# Patient Record
Sex: Male | Born: 1995 | Race: Black or African American | Hispanic: No | Marital: Single | Smoking: Never smoker
Health system: Southern US, Community
[De-identification: ages and names within clinical notes are randomized; demographics above are authoritative.]

## PROBLEM LIST (undated history)

## (undated) DIAGNOSIS — J302 Other seasonal allergic rhinitis: Secondary | ICD-10-CM

---
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
## 2016-01-04 ENCOUNTER — Emergency Department (HOSPITAL_COMMUNITY)
Admission: EM | Admit: 2016-01-04 | Discharge: 2016-01-04 | Disposition: A | Discharge status: Home / Self Care | Payer: Medicaid Other | Attending: Emergency Medicine | Admitting: Emergency Medicine

## 2016-01-04 ENCOUNTER — Encounter (HOSPITAL_COMMUNITY): Payer: Self-pay | Admitting: Emergency Medicine

## 2016-01-04 ENCOUNTER — Emergency Department (HOSPITAL_COMMUNITY): Payer: Medicaid Other

## 2016-01-04 DIAGNOSIS — R079 Chest pain, unspecified: Secondary | ICD-10-CM | POA: Diagnosis present

## 2016-01-04 DIAGNOSIS — R0789 Other chest pain: Secondary | ICD-10-CM

## 2016-01-04 HISTORY — DX: Other seasonal allergic rhinitis: J30.2

## 2016-01-04 LAB — I-STAT TROPONIN, ED
Troponin i, poc: 0 ng/mL (ref 0.00–0.08)
Troponin i, poc: 0.01 ng/mL (ref 0.00–0.08)

## 2016-01-04 LAB — BASIC METABOLIC PANEL
Anion gap: 10 (ref 5–15)
BUN: 11 mg/dL (ref 6–20)
CO2: 25 mmol/L (ref 22–32)
Calcium: 9.4 mg/dL (ref 8.9–10.3)
Chloride: 104 mmol/L (ref 101–111)
Creatinine, Ser: 1.03 mg/dL (ref 0.61–1.24)
GFR calc Af Amer: >60 mL/min (ref >60)
GFR calc non Af Amer: >60 mL/min (ref >60)
Glucose, Bld: 95 mg/dL (ref 65–99)
Potassium: 4.1 mmol/L (ref 3.5–5.1)
Sodium: 139 mmol/L (ref 135–145)

## 2016-01-04 LAB — CBC
HCT: 48.5 % (ref 39.0–52.0)
Hemoglobin: 16.6 g/dL (ref 13.0–17.0)
MCH: 29.6 pg (ref 26.0–34.0)
MCHC: 34.2 g/dL (ref 30.0–36.0)
MCV: 86.6 fL (ref 78.0–100.0)
Platelets: 209 10*3/uL (ref 150–400)
RBC: 5.6 MIL/uL (ref 4.22–5.81)
RDW: 13.1 % (ref 11.5–15.5)
WBC: 6.2 10*3/uL (ref 4.0–10.5)

## 2016-01-04 NOTE — ED Provider Notes (Signed)
CSN: [id number]     Arrival date & time 01/04/16  1203 History   First MD Initiated Contact with Patient 01/04/16 1605     Chief Complaint  Patient presents with  . Chest Pain     (Consider location/radiation/quality/duration/timing/severity/associated sxs/prior Treatment) HPI   Scott May is an 20 y.o M with no significant pmhx who presents to the ED today c/o chest pain. Pt states that he was walking to class this morning when he felt sudden onset left sided chest pain that progressively worsened. Pt states he had one twinge of pain in his left shoulder but that has since resolved and lasted only momentarily. Pain has been intermittent since that time and now only hurts if he pushes on his chest wall. No associated SOB, diaphoresis, dizziness, paresthesias, syncope, n/v/d, HA, blurry vision.   Past Medical History  Diagnosis Date  . Seasonal allergies    History reviewed. No pertinent past surgical history. History reviewed. No pertinent family history. Social History  Substance Use Topics  . Smoking status: Never Smoker   . Smokeless tobacco: None  . Alcohol Use: No    Review of Systems  All other systems reviewed and are negative.     Allergies  Shellfish allergy  Home Medications   Prior to Admission medications   Medication Sig Start Date End Date Taking? Authorizing Provider  acetaminophen (TYLENOL) 325 MG tablet Take 650 mg by mouth every 6 (six) hours as needed for mild pain, moderate pain or headache.   Yes Historical Provider, MD  cetirizine (ZYRTEC) 10 MG tablet TAKE 1 TABLET BY MOUTH EVERY NIGHT AT BEDTIME AS NEEDED FOR ALLERGIES Patient taking differently: TAKE 10 MG BY MOUTH EVERY NIGHT AT BEDTIME AS NEEDED FOR ALLERGIES 03/14/13  Yes Maia Breslowenise Perez-Fiery, MD  montelukast (SINGULAIR) 5 MG chewable tablet CHEW AND SWALLOW 1 TABLET DAILY Patient not taking: Reported on 01/04/2016 03/14/13   Angelique Blonderenise Perez-Fiery, MD   BP 155/90 mmHg  Pulse 64  Temp(Src)  97.8 F (36.6 C) (Oral)  Resp 18  SpO2 100% Physical Exam  Constitutional: He is oriented to person, place, and time. He appears well-developed and well-nourished. No distress.  HENT:  Head: Normocephalic and atraumatic.  Mouth/Throat: No oropharyngeal exudate.  Eyes: Conjunctivae and EOM are normal. Pupils are equal, round, and reactive to light. Right eye exhibits no discharge. Left eye exhibits no discharge. No scleral icterus.  Cardiovascular: Normal rate, regular rhythm, normal heart sounds and intact distal pulses.  Exam reveals no gallop and no friction rub.   No murmur heard. Pulmonary/Chest: Effort normal and breath sounds normal. No respiratory distress. He has no wheezes. He has no rales. He exhibits tenderness ( left chest wall).  Abdominal: Soft. He exhibits no distension. There is no tenderness. There is no guarding.  Musculoskeletal: Normal range of motion. He exhibits no edema.  Neurological: He is alert and oriented to person, place, and time.  Strength 5/5 throughout. No sensory deficits.  No gait abnormality.   Skin: Skin is warm and dry. No rash noted. He is not diaphoretic. No erythema. No pallor.  Psychiatric: He has a normal mood and affect. His behavior is normal.  Nursing note and vitals reviewed.   ED Course  Procedures (including critical care time) Labs Review Labs Reviewed  BASIC METABOLIC PANEL  CBC  I-STAT TROPOININ, ED  Rosezena SensorI-STAT TROPOININ, ED    Imaging Review Dg Chest 2 View  01/04/2016  CLINICAL DATA:  Left-sided chest pain for 1 day. EXAM: CHEST  2 VIEW COMPARISON:  April 03, 2009 FINDINGS: Lungs are clear. Heart size and pulmonary vascularity are normal. No adenopathy. No pneumothorax. No bone lesions. IMPRESSION: No edema or consolidation. Electronically Signed   By: Bretta Bang III M.D.   On: 01/04/2016 13:04   I have personally reviewed and evaluated these images and lab results as part of my medical decision-making.   EKG  Interpretation   Date/Time:  Thursday January 04 2016 12:09:57 EDT Ventricular Rate:  75 PR Interval:  130 QRS Duration: 80 QT Interval:  344 QTC Calculation: 384 R Axis:   87 Text Interpretation:  Sinus rhythm No old tracing to compare Confirmed by  [facility]  MD, ELLIOTT [phone]) on 01/04/2016 4:49:51 PM      MDM   Final diagnoses:  Chest wall pain    Patient is to be discharged with recommendation to follow up with PCP in regards to today's hospital visit. Chest pain is not likely of cardiac or pulmonary etiology d/t presentation, perc negative, VSS, no tracheal deviation, no JVD or new murmur, RRR, breath sounds equal bilaterally. Chest pain is reproducible with palpation on exam. Likely musculoskeletal in etiology. EKG without acute abnormalities, normal sinus rhythm. Serial negative troponin, and negative CXR. Pt has been advised to take ibuprofen for pain and return to the ED is CP becomes exertional, associated with diaphoresis or nausea, radiates to left jaw/arm, worsens or becomes concerning in any way. Pt appears reliable for follow up and is agreeable to discharge.   Case has been discussed with and seen by Dr. Effie Shy who agrees with the above plan to discharge.      Lester Kinsman [place], PA-C 01/04/16 1723  Mancel Bale, MD 01/05/16 [phone]

## 2016-01-04 NOTE — Discharge Instructions (Signed)
Chest Wall Pain Chest wall pain is pain in or around the bones and muscles of your chest. Sometimes, an injury causes this pain. Sometimes, the cause may not be known. This pain may take several weeks or longer to get better. HOME CARE Pay attention to any changes in your symptoms. Take these actions to help with your pain:  Rest as told by your doctor.  Avoid activities that cause pain. Try not to use your chest, belly (abdominal), or side muscles to lift heavy things.  If directed, apply ice to the painful area:  Put ice in a plastic bag.  Place a towel between your skin and the bag.  Leave the ice on for 20 minutes, 2-3 times per day.  Take over-the-counter and prescription medicines only as told by your doctor.  Do not use tobacco products, including cigarettes, chewing tobacco, and e-cigarettes. If you need help quitting, ask your doctor.  Keep all follow-up visits as told by your doctor. This is important. GET HELP IF:  You have a fever.  Your chest pain gets worse.  You have new symptoms. GET HELP RIGHT AWAY IF:  You feel sick to your stomach (nauseous) or you throw up (vomit).  You feel sweaty or light-headed.  You have a cough with phlegm (sputum) or you cough up blood.  You are short of breath.   This information is not intended to replace advice given to you by your health care provider. Make sure you discuss any questions you have with your health care provider.   Follow-up with your primary care provider or the [facility] as needed or if her symptoms haven't improved. Take ibuprofen or Tylenol for pain. Return to the emergency department if you experience severe worsening of your symptoms, difficulty breathing, dizziness, loss of consciousness, numbness or tingling in extremity, significant sweating, vomiting.

## 2016-01-04 NOTE — ED Notes (Signed)
Patient states he was leaving his class this morning when he began to have left sided chest pain that radiated into his left shoulder. Patient states the pain feels like pressure and is tight, also intermittent. Patient states this is the first time this has happened. Denies any recent injuries, denies any stress triggers. Patient also reports a headache to left side of his head. Patient is A&Ox4, NAD noted.

## 2018-09-29 ENCOUNTER — Emergency Department (HOSPITAL_COMMUNITY): Payer: 59

## 2018-09-29 ENCOUNTER — Encounter (HOSPITAL_COMMUNITY): Payer: Self-pay | Admitting: Emergency Medicine

## 2018-09-29 ENCOUNTER — Emergency Department (HOSPITAL_COMMUNITY)
Admission: EM | Admit: 2018-09-29 | Discharge: 2018-09-29 | Disposition: A | Discharge status: Home / Self Care | Payer: 59 | Attending: Emergency Medicine | Admitting: Emergency Medicine

## 2018-09-29 DIAGNOSIS — R1031 Right lower quadrant pain: Secondary | ICD-10-CM | POA: Insufficient documentation

## 2018-09-29 DIAGNOSIS — R112 Nausea with vomiting, unspecified: Secondary | ICD-10-CM | POA: Diagnosis present

## 2018-09-29 LAB — URINALYSIS, ROUTINE W REFLEX MICROSCOPIC
Bilirubin Urine: NEGATIVE
Glucose, UA: NEGATIVE mg/dL
Hgb urine dipstick: NEGATIVE
Ketones, ur: 20 mg/dL — AB
Leukocytes, UA: NEGATIVE
Nitrite: NEGATIVE
Protein, ur: NEGATIVE mg/dL
Specific Gravity, Urine: 1.043 — ABNORMAL HIGH (ref 1.005–1.030)
pH: 6 (ref 5.0–8.0)

## 2018-09-29 LAB — CBC
HCT: 50 % (ref 39.0–52.0)
Hemoglobin: 16.5 g/dL (ref 13.0–17.0)
MCH: 29.3 pg (ref 26.0–34.0)
MCHC: 33 g/dL (ref 30.0–36.0)
MCV: 88.7 fL (ref 80.0–100.0)
Platelets: 170 10*3/uL (ref 150–400)
RBC: 5.64 MIL/uL (ref 4.22–5.81)
RDW: 13.2 % (ref 11.5–15.5)
WBC: 9.9 10*3/uL (ref 4.0–10.5)
nRBC: 0 % (ref 0.0–0.2)

## 2018-09-29 LAB — COMPREHENSIVE METABOLIC PANEL
ALT: 29 U/L (ref 0–44)
AST: 27 U/L (ref 15–41)
Albumin: 4.2 g/dL (ref 3.5–5.0)
Alkaline Phosphatase: 38 U/L (ref 38–126)
Anion gap: 11 (ref 5–15)
BUN: 9 mg/dL (ref 6–20)
CO2: 26 mmol/L (ref 22–32)
Calcium: 8.8 mg/dL — ABNORMAL LOW (ref 8.9–10.3)
Chloride: 104 mmol/L (ref 98–111)
Creatinine, Ser: 1.04 mg/dL (ref 0.61–1.24)
GFR calc Af Amer: >60 mL/min (ref >60)
GFR calc non Af Amer: >60 mL/min (ref >60)
Glucose, Bld: 118 mg/dL — ABNORMAL HIGH (ref 70–99)
Potassium: 4.1 mmol/L (ref 3.5–5.1)
Sodium: 141 mmol/L (ref 135–145)
Total Bilirubin: 2.4 mg/dL — ABNORMAL HIGH (ref 0.3–1.2)
Total Protein: 6.7 g/dL (ref 6.5–8.1)

## 2018-09-29 LAB — LIPASE, BLOOD: Lipase: 22 U/L (ref 11–51)

## 2018-09-29 MED ORDER — SODIUM CHLORIDE (PF) 0.9 % IJ SOLN
INTRAMUSCULAR | Status: AC
  Filled 2018-09-29: qty 50

## 2018-09-29 MED ORDER — IOPAMIDOL (ISOVUE-300) INJECTION 61%
INTRAVENOUS | Status: AC
  Filled 2018-09-29: qty 100

## 2018-09-29 MED ORDER — ONDANSETRON 8 MG PO TBDP: 8.0000 mg | ORAL_TABLET | Freq: Three times a day (TID) | ORAL | 0 refills | Status: AC | PRN

## 2018-09-29 MED ORDER — SODIUM CHLORIDE 0.9 % IV BOLUS
1000.0000 mL | Freq: Once | INTRAVENOUS | Status: AC
  Administered 2018-09-29: 1000 mL via INTRAVENOUS

## 2018-09-29 MED ORDER — ONDANSETRON HCL 4 MG/2ML IJ SOLN
4.0000 mg | Freq: Once | INTRAMUSCULAR | Status: AC
  Administered 2018-09-29: 4 mg via INTRAVENOUS
  Filled 2018-09-29: qty 2

## 2018-09-29 MED ORDER — MORPHINE SULFATE (PF) 2 MG/ML IV SOLN
2.0000 mg | Freq: Once | INTRAVENOUS | Status: AC
  Administered 2018-09-29: 2 mg via INTRAVENOUS
  Filled 2018-09-29: qty 1

## 2018-09-29 MED ORDER — IOPAMIDOL (ISOVUE-300) INJECTION 61%
100.0000 mL | Freq: Once | INTRAVENOUS | Status: AC | PRN
  Administered 2018-09-29: 100 mL via INTRAVENOUS

## 2018-09-29 NOTE — ED Provider Notes (Signed)
[facility]-EMERGENCY DEPT Provider Note   CSN: [id number] Arrival date & time: 09/29/18  1415     History   Chief Complaint Chief Complaint  Patient presents with  . Abdominal Pain  . Nausea  . Emesis    HPI Scott May is a 22 y.o. male.  HPI   22 year old male previously healthy presents today with onset of nausea vomiting and abdominal pain that began last night.  Patient states last night he woke up from sleep and vomited.  Continued to vomit multiple times.  He has had somewhat hot and cold but has not taken his temperature.  He has noted some abdominal pain that is worse in the right lower quadrant.  He states when he goes to vomit is in the epigastrium but then it seemed to settle into the right lower quadrant.  He has not had any diarrhea or urinary tract infection symptoms.  He denies any previous similar symptoms.  He states he has been unable to take anything by mouth today.  He has not treated this with anything.  He rates the pain a 5 out of 10.  It is worse today but is unable to associate with anything specific  Past Medical History:  Diagnosis Date  . Seasonal allergies     There are no active problems to display for this patient.   History reviewed. No pertinent surgical history.      Home Medications    Prior to Admission medications   Medication Sig Start Date End Date Taking? Authorizing Provider  acetaminophen (TYLENOL) 325 MG tablet Take 650 mg by mouth every 6 (six) hours as needed for mild pain, moderate pain or headache.    [provider]  cetirizine (ZYRTEC) 10 MG tablet TAKE 1 TABLET BY MOUTH EVERY NIGHT AT BEDTIME AS NEEDED FOR ALLERGIES Patient taking differently: TAKE 10 MG BY MOUTH EVERY NIGHT AT BEDTIME AS NEEDED FOR ALLERGIES 03/14/13   Perez-Fiery, Angelique Blonder, MD  montelukast (SINGULAIR) 5 MG chewable tablet CHEW AND SWALLOW 1 TABLET DAILY Patient not taking: Reported on 01/04/2016 03/14/13   Perez-Fiery,  Angelique Blonder, MD    Family History History reviewed. No pertinent family history.  Social History Social History   Tobacco Use  . Smoking status: Never Smoker  . Smokeless tobacco: Never Used  Substance Use Topics  . Alcohol use: No  . Drug use: No     Allergies   Shellfish allergy   Review of Systems Review of Systems  All other systems reviewed and are negative.    Physical Exam Updated Vital Signs BP 140/78 (BP Location: Left Arm)   Pulse (!) 116   Temp 98.7 F (37.1 C) (Oral)   Resp 15   Ht 1.88 m (6\' 2" )   Wt 72.6 kg   SpO2 98%   BMI 20.54 kg/m  Not tachycardiac on my exam Physical Exam  Constitutional: He is oriented to person, place, and time. He appears well-developed and well-nourished.  HENT:  Head: Normocephalic and atraumatic.  Right Ear: External ear normal.  Left Ear: External ear normal.  Nose: Nose normal.  Mouth/Throat: Oropharynx is clear and moist.  Eyes: Pupils are equal, round, and reactive to light. Conjunctivae and EOM are normal.  Neck: Normal range of motion. Neck supple.  Cardiovascular: Normal rate, regular rhythm, normal heart sounds and intact distal pulses.  Pulmonary/Chest: Effort normal and breath sounds normal. No respiratory distress. He has no wheezes. He exhibits no tenderness.  Abdominal: Soft. Bowel sounds  are normal. He exhibits no distension and no mass. There is tenderness in the right lower quadrant. There is no guarding.  Musculoskeletal: Normal range of motion.  Neurological: He is alert and oriented to person, place, and time. He has normal reflexes. He exhibits normal muscle tone. Coordination normal.  Skin: Skin is warm and dry.  Psychiatric: He has a normal mood and affect. His behavior is normal. Judgment and thought content normal.  Nursing note and vitals reviewed.    ED Treatments / Results  Labs (all labs ordered are listed, but only abnormal results are displayed) Labs Reviewed  COMPREHENSIVE METABOLIC  PANEL - Abnormal; Notable for the following components:      Result Value   Glucose, Bld 118 (*)    Calcium 8.8 (*)    Total Bilirubin 2.4 (*)    All other components within normal limits  LIPASE, BLOOD  CBC  URINALYSIS, ROUTINE W REFLEX MICROSCOPIC    EKG None  Radiology No results found.  Procedures Procedures (including critical care time)  Medications Ordered in ED Medications  sodium chloride 0.9 % bolus 1,000 mL (has no administration in time range)  morphine 2 MG/ML injection 2 mg (has no administration in time range)  ondansetron (ZOFRAN) injection 4 mg (has no administration in time range)     Initial Impression / Assessment and Plan / ED Course  I have reviewed the triage vital signs and the nursing notes.  Pertinent labs & imaging results that were available during my care of the patient were reviewed by me and considered in my medical decision making (see chart for details).     22 year old male presents today with nausea vomiting and right lower quadrant pain.  His made n.p.o., placed on IV fluids and given IV fluid bolus.  CT will be obtained to evaluate abdomen specifically with concerns for appendicitis  Final Clinical Impressions(s) / ED Diagnoses   Final diagnoses:  Nausea and vomiting, intractability of vomiting not specified, unspecified vomiting type  Right lower quadrant abdominal pain    ED Discharge Orders    None       Margarita Grizzleay, Jerzi Tigert, MD 09/29/18 1954

## 2018-09-29 NOTE — Discharge Instructions (Addendum)
There is no evidence of appendicitis or other acute intra-abdominal abnormality seen on your CAT scan Please use Zofran and frequent sips of Gatorade mixed half-and-half with water Return for recheck if you have worsening pain or symptoms do not resolve the next 24 to 48 hours

## 2018-09-29 NOTE — ED Triage Notes (Signed)
Patient here from home with complaints of abd pain, n/v that started last night. Unable to keep anything down.

## 2019-11-14 IMAGING — CT CT ABD-PELV W/ CM
2 of 4 series · 16 of 46 positions shown, 18 images · IV contrast (ISOVUE)
Comparison: None.

CLINICAL DATA: Nausea and vomiting

EXAM:
CT ABDOMEN AND PELVIS WITH CONTRAST
TECHNIQUE: Multidetector CT imaging of the abdomen and pelvis was performed
using the standard protocol following bolus administration of
intravenous contrast.
CONTRAST:  100mL [id number] IOPAMIDOL ([id number]) INJECTION 61%

[Series 2: axial st · axial · 0.68mm/px · z∈[-265,+160]mm · 13 of 96 slices shown, 15 images]
[im 6/96  soft-tissue]
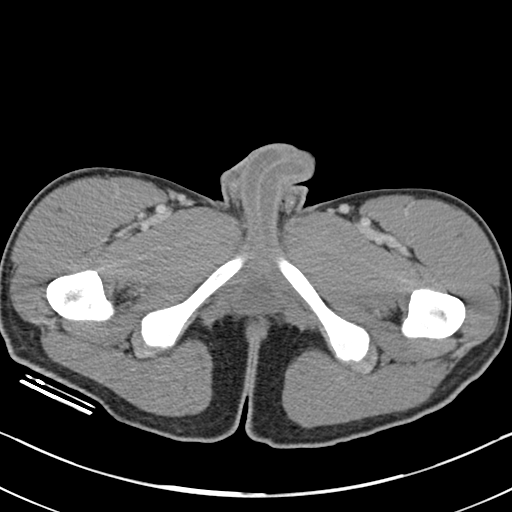
[im 6/96  bone]
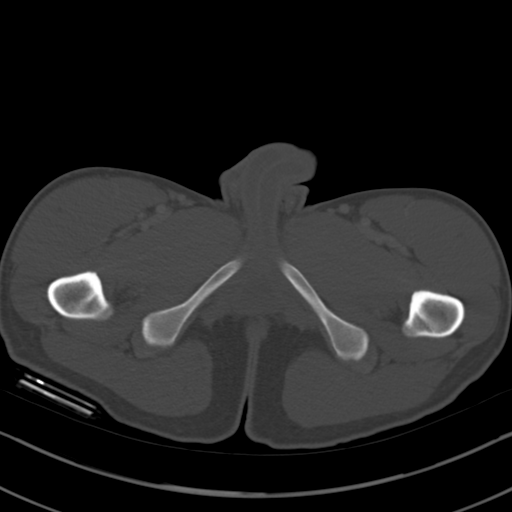
[im 16/96  soft-tissue]
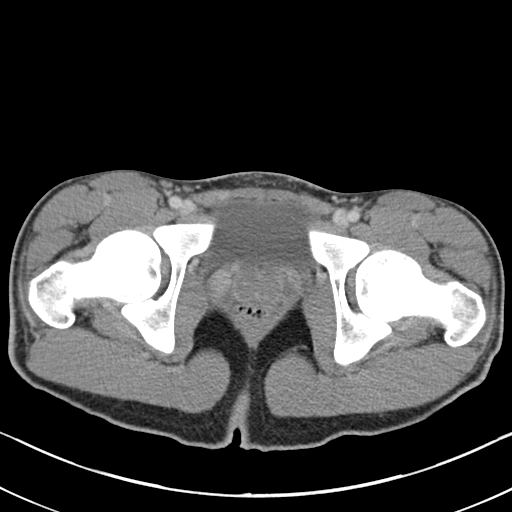
[im 21/96  soft-tissue]
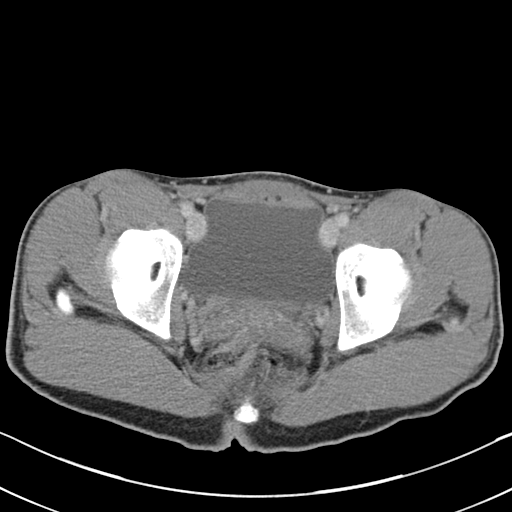
[im 26/96  soft-tissue]
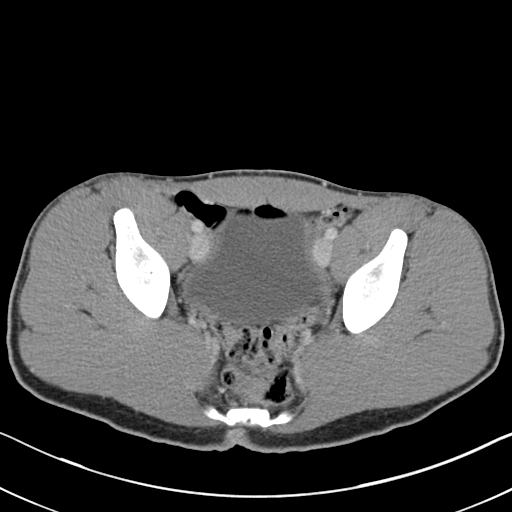
[im 36/96  soft-tissue]
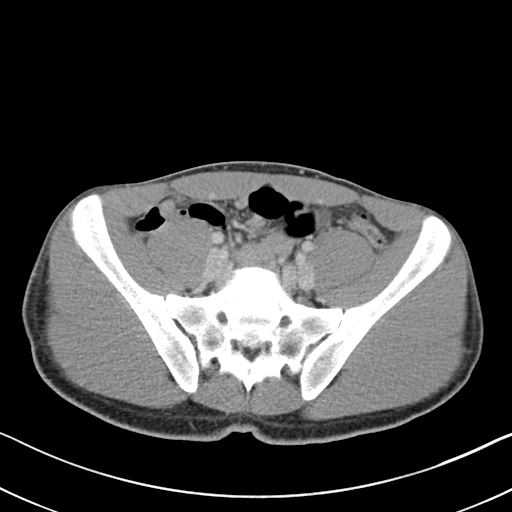
[im 41/96  soft-tissue]
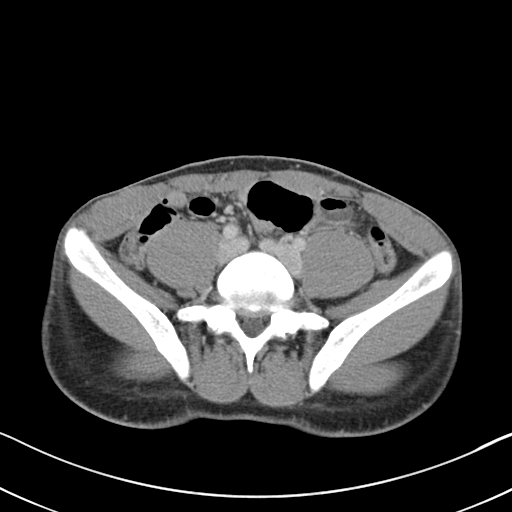
[im 51/96  soft-tissue]
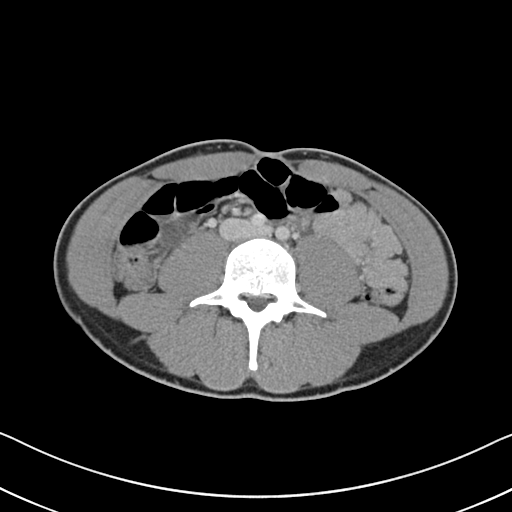
[im 56/96  soft-tissue]
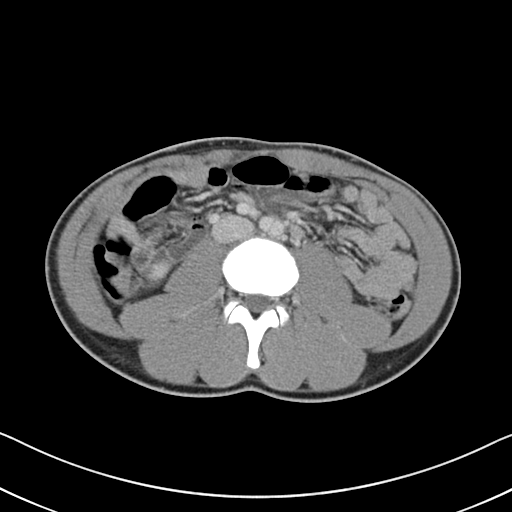
[im 61/96  soft-tissue]
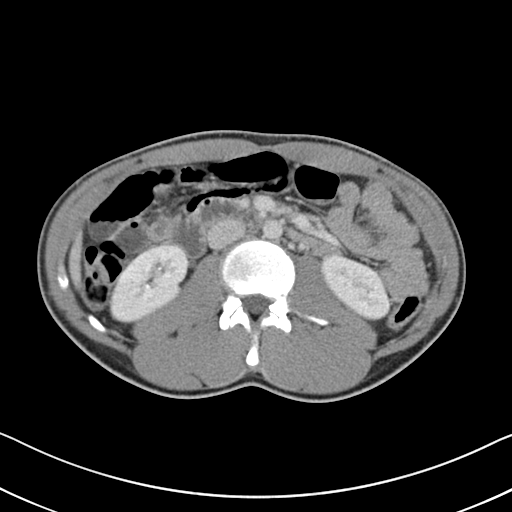
[im 61/96  bone]
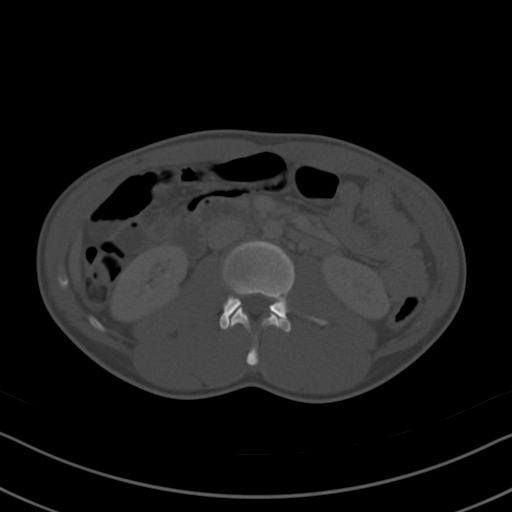
[im 71/96  soft-tissue]
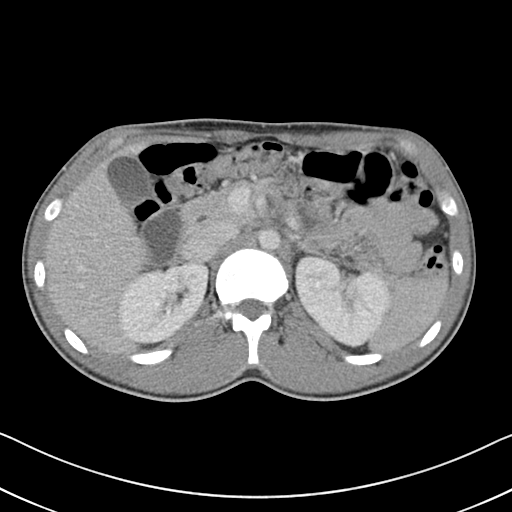
[im 76/96  soft-tissue]
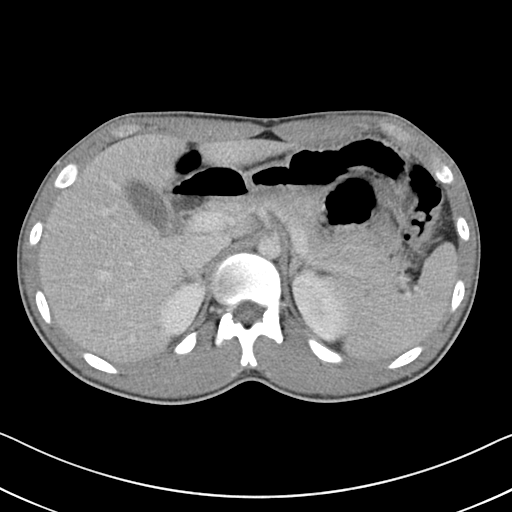
[im 81/96  soft-tissue]
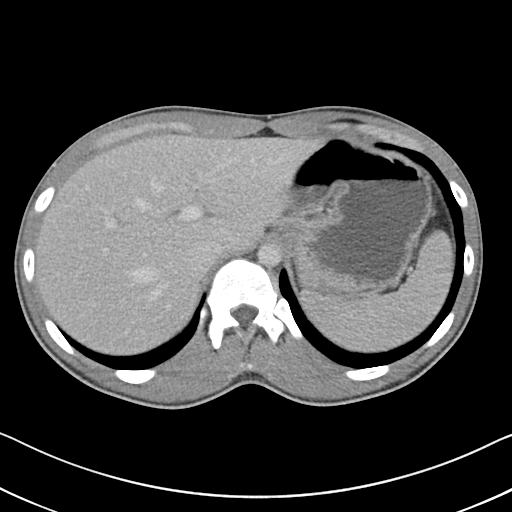
[im 91/96  soft-tissue]
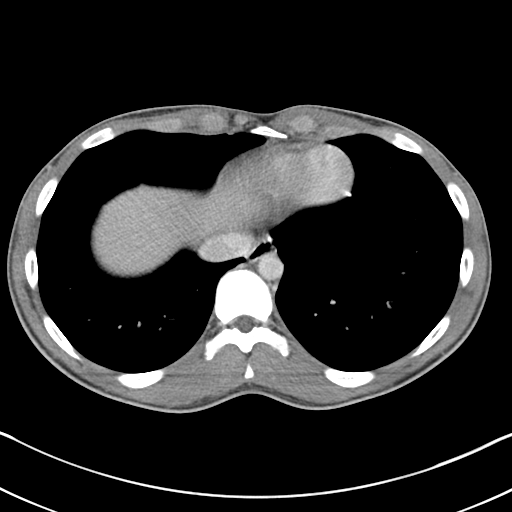

[Series 5: coronal st · coronal · 0.61mm/px · 3 of 64 slices shown]
[im 22/64  soft-tissue]
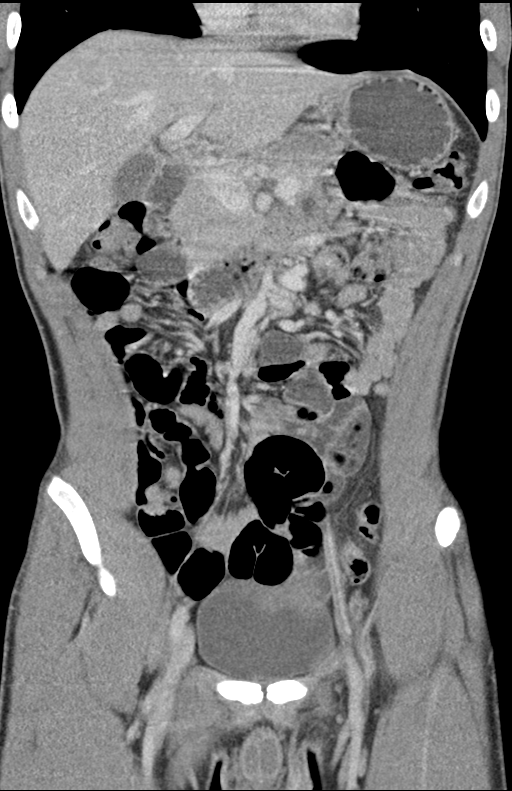
[im 29/64  soft-tissue]
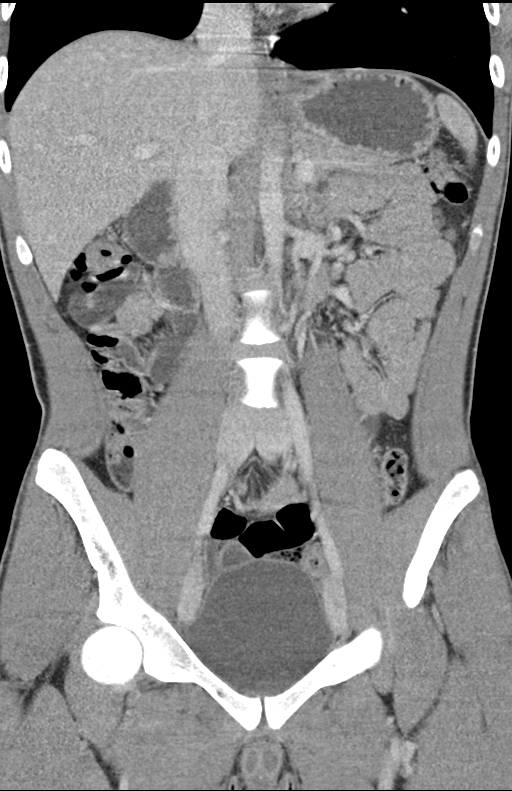
[im 36/64  soft-tissue]
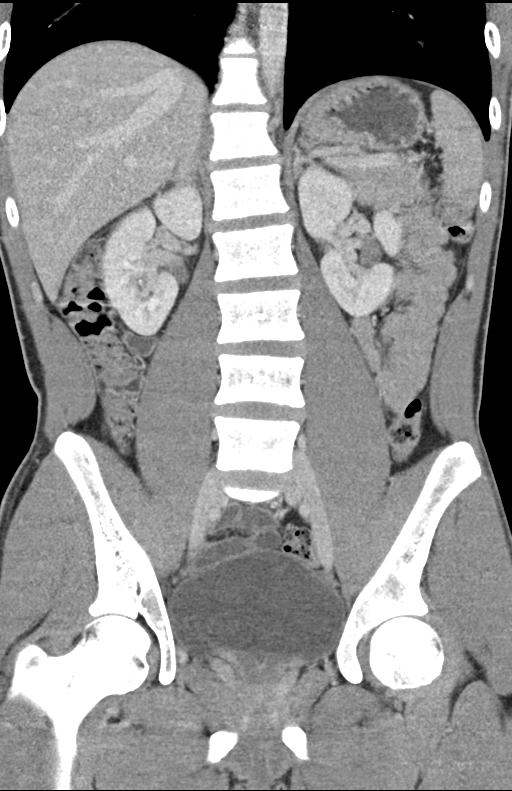

[16 of 46 positions shown; findings below may reference images not displayed]

FINDINGS: Lower chest: Lung bases clear bilaterally

Hepatobiliary: No focal liver abnormality is seen. No gallstones,
gallbladder wall thickening, or biliary dilatation.

Pancreas: Negative

Spleen: Negative

Adrenals/Urinary Tract: Subcentimeter fatty lesion in the right
upper pole likely angiomyolipoma. Small densities in the right
kidney likely early excretion of contrast. No renal obstruction.
Normal urinary bladder.

Stomach/Bowel: Normal stomach. Negative for bowel obstruction. No
bowel mass or edema. Normal appendix.

Vascular/Lymphatic: No significant vascular findings are present. No
enlarged abdominal or pelvic lymph nodes.

Reproductive: Normal prostate

Other: No free fluid.

Musculoskeletal: Negative
IMPRESSION: No acute abnormality

Small densities in right kidney likely early excretion of contrast
or other than nonobstructing renal calculi.

Normal appendix.

## 2023-05-17 ENCOUNTER — Emergency Department (HOSPITAL_COMMUNITY)
Admission: EM | Admit: 2023-05-17 | Discharge: 2023-05-17 | Disposition: A | Discharge status: Home / Self Care | Payer: 59 | Attending: Emergency Medicine | Admitting: Emergency Medicine

## 2023-05-17 ENCOUNTER — Other Ambulatory Visit: Payer: Self-pay

## 2023-05-17 ENCOUNTER — Encounter (HOSPITAL_COMMUNITY): Payer: Self-pay | Admitting: Emergency Medicine

## 2023-05-17 DIAGNOSIS — J028 Acute pharyngitis due to other specified organisms: Secondary | ICD-10-CM | POA: Insufficient documentation

## 2023-05-17 DIAGNOSIS — B9789 Other viral agents as the cause of diseases classified elsewhere: Secondary | ICD-10-CM | POA: Insufficient documentation

## 2023-05-17 DIAGNOSIS — J029 Acute pharyngitis, unspecified: Secondary | ICD-10-CM | POA: Diagnosis present

## 2023-05-17 LAB — GROUP A STREP BY PCR: Group A Strep by PCR: NOT DETECTED

## 2023-05-17 MED ORDER — PREDNISONE 10 MG PO TABS: 40.0000 mg | ORAL_TABLET | Freq: Every day | ORAL | 0 refills | Status: AC

## 2023-05-17 MED ORDER — DEXAMETHASONE 4 MG PO TABS
10.0000 mg | ORAL_TABLET | Freq: Once | ORAL | Status: AC
  Administered 2023-05-17: 10 mg via ORAL
  Filled 2023-05-17: qty 3

## 2023-05-17 NOTE — ED Provider Notes (Signed)
[place] EMERGENCY DEPARTMENT AT [facility] Provider Note   CSN: [id number] Arrival date & time: 05/17/23  3875     History  Chief Complaint  Patient presents with   Sore Throat    Scott May is a 27 y.o. male with past medical history seasonal allergies who presents to the ED complaining of a sore throat and voice hoarseness since yesterday morning.  No known sick contacts.  No fever, chills, nausea, vomiting, diarrhea, difficulty swallowing, chest pain, or shortness of breath.  Tolerating secretions, normal p.o. intake.  No cough or congestion.  No body aches or fatigue.  No history of diabetes or immune disorders.  No history of recurrent symptoms similar to these.      Home Medications Prior to Admission medications   Medication Sig Start Date End Date Taking? Authorizing Provider  predniSONE (DELTASONE) 10 MG tablet Take 4 tablets (40 mg total) by mouth daily for 4 days. 05/18/23 05/22/23 Yes Clifford Coudriet L, PA-C  cetirizine (ZYRTEC) 10 MG tablet TAKE 1 TABLET BY MOUTH EVERY NIGHT AT BEDTIME AS NEEDED FOR ALLERGIES Patient taking differently: TAKE 10 MG BY MOUTH EVERY NIGHT AT BEDTIME AS NEEDED FOR ALLERGIES 03/14/13   Perez-Fiery, Angelique Blonder, MD  montelukast (SINGULAIR) 5 MG chewable tablet CHEW AND SWALLOW 1 TABLET DAILY 03/14/13   Perez-Fiery, Angelique Blonder, MD  ondansetron (ZOFRAN ODT) 8 MG disintegrating tablet Take 1 tablet (8 mg total) by mouth every 8 (eight) hours as needed for nausea or vomiting. 09/29/18   Margarita Grizzle, MD      Allergies    Shellfish allergy    Review of Systems   Review of Systems  All other systems reviewed and are negative.   Physical Exam Updated Vital Signs BP (!) 144/90 (BP Location: Right Arm)   Pulse (!) 56   Temp 98.5 F (36.9 C) (Oral)   Resp 16   Ht 6\' 2"  (1.88 m)   Wt 72.6 kg   SpO2 100%   BMI 20.54 kg/m  Physical Exam Vitals and nursing note reviewed.  Constitutional:      General: He is not in acute  distress.    Appearance: Normal appearance. He is not ill-appearing, toxic-appearing or diaphoretic.  HENT:     Head: Normocephalic and atraumatic.     Jaw: There is normal jaw occlusion. No trismus, tenderness, swelling, pain on movement or malocclusion.     Mouth/Throat:     Lips: No lesions.     Mouth: Mucous membranes are moist. No oral lesions or angioedema.     Dentition: Normal dentition. No gingival swelling or gum lesions.     Tongue: No lesions. Tongue does not deviate from midline.     Palate: No lesions.     Pharynx: Oropharynx is clear. Uvula midline. Posterior oropharyngeal erythema present. No pharyngeal swelling, oropharyngeal exudate, uvula swelling or postnasal drip.     Tonsils: No tonsillar exudate or tonsillar abscesses.     Comments: No evidence PTA/RPA, widely patent airway, normal phonation, no stridor, no submandibular, sublingual, or submental tenderness, fluctuance, or induration Eyes:     Conjunctiva/sclera: Conjunctivae normal.  Cardiovascular:     Rate and Rhythm: Normal rate and regular rhythm.     Heart sounds: No murmur heard. Pulmonary:     Effort: Pulmonary effort is normal.     Breath sounds: Normal breath sounds.  Abdominal:     General: Abdomen is flat.     Palpations: Abdomen is soft.  Musculoskeletal:  General: Normal range of motion.     Cervical back: Normal range of motion and neck supple.     Right lower leg: No edema.     Left lower leg: No edema.  Lymphadenopathy:     Cervical: No cervical adenopathy.  Skin:    General: Skin is warm and dry.     Capillary Refill: Capillary refill takes less than 2 seconds.  Neurological:     Mental Status: He is alert. Mental status is at baseline.  Psychiatric:        Behavior: Behavior normal.     ED Results / Procedures / Treatments   Labs (all labs ordered are listed, but only abnormal results are displayed) Labs Reviewed  GROUP A STREP BY PCR    EKG None  Radiology No  results found.  Procedures Procedures    Medications Ordered in ED Medications  dexamethasone (DECADRON) tablet 10 mg (has no administration in time range)    ED Course/ Medical Decision Making/ A&P                             Medical Decision Making Amount and/or Complexity of Data Reviewed Labs: ordered. Decision-making details documented in ED Course.  Risk Prescription drug management.   Medical Decision Making:   Scott May is a 27 y.o. male who presented to the ED today with sore throat detailed above.     Complete initial physical exam performed, notably the patient was in no acute distress.  He had moderate pharyngeal erythema but widely patent airway, normal phonation, no stridor.  Lungs clear to auscultation.  No respiratory distress.  No evidence of PTA, RPA, or Ludwig's angina.  No dental abscess. No facial swelling.    Reviewed and confirmed nursing documentation for past medical history, family history, social history.    Initial Assessment:   With the patient's presentation, differential diagnosis includes but is not limited to strep pharyngitis, viral pharyngitis, bronchitis, URI, Ludwig's angina, retropharyngeal abscess, peritonsillar abscess.  This is most consistent with an acute complicated illness  Initial Plan:  Strep swab Steroids to help with any underlying bronchitis/inflammation Objective evaluation as below reviewed   Initial Study Results:   Laboratory  All laboratory results reviewed without evidence of clinically relevant pathology.     Final Assessment and Plan:   27 year old male presents to the ED complaining of sore throat. Well appearing.  He does have pharyngeal erythema but no obvious exudates.  Patent airway.  Tolerating secretions.  Normal phonation, no stridor, no respiratory distress.  No signs of Ludwig's angina.  No evidence of PTA/RPA.  Dentition appears normal.  Strep swab negative.  Suspect a viral pharyngitis.  Will  give a dose of steroids here as well as 4-day course of steroids at home.  Provided with primary care follow-up.  Discussed strict ED return precautions for worsening condition including swelling of the throat or face, fevers, difficulty breathing or swallowing, etc.  Patient expressed understanding of this.  All questions answered and stable for discharge.   Clinical Impression:  1. Viral pharyngitis      Discharge           Final Clinical Impression(s) / ED Diagnoses Final diagnoses:  Viral pharyngitis    Rx / DC Orders ED Discharge Orders          Ordered    predniSONE (DELTASONE) 10 MG tablet  Daily  05/17/23 0958              Tonette Lederer, PA-C 05/17/23 1002    Arby Barrette, MD 06/03/23 [phone]

## 2023-05-17 NOTE — Discharge Instructions (Signed)
Thank you for letting us take care of you today.  Your strep swab was negative. We did not see signs of an abscess in your mouth or throat and your vital signs are reassuring. We gave you a dose of steroids to help with your symptoms. I am sending you home on steroids over the next few days to help with your symptoms.  As you got the dose in the ED today, he can start the ones at home tomorrow.  Your symptoms are likely related to a virus.  Please take your instructions below on how to care for self while recovering.  Thank you for letting us take care of you today. Overall, your workup and exam were reassuring and I believe your symptoms are likely related to a virus. Please see instructions below and attached for ways to care for yourself while recovering from your illness.  Viral Illness TREATMENT  Treatment is directed at relieving symptoms. There is no cure. Antibiotics are not effective because the infection is caused by a virus, not by bacteria. Treatment may include:  Increased fluid intake. Sports drinks offer valuable electrolytes, sugars, and fluids. Examples include Gatorade, Powerade, Pedialyte, etc. Water is also a good choice. Breathing heated mist or steam (vaporizer or shower).  Eating chicken soup or other clear broths and maintaining good nutrition.  Getting plenty of rest.  Using gargles or lozenges for comfort.  Increasing usage of your inhaler if you have bronchitis or asthma.  You may return to work when your temperature has returned to normal.  Gargle warm salt water and spit it out for sore throat. Benadryl can help decrease sinus secretions. Continue to alternate between Tylenol and ibuprofen for fever control, pain, and discomfort. For adults, you can take up to 1000mg  Tylenol every 6 hours and/or 600mg  ibuprofen every 6 hours. Do not take more than 4000mg  Tylenol or 3200mg  ibuprofen in 24 hours. For children, medication should be weight dosed. Please refer to attached  dosing charts and/or medication bottle for appropriate dosing.   Follow up with your primary care doctor in 5-7 days for recheck of ongoing symptoms.  Return to emergency department for emergent changing or worsening of symptoms.

## 2023-05-17 NOTE — ED Triage Notes (Signed)
Pt reports sore throat for the last 2 days and feeling dizzy. Pt reports slightly less po intake than normal.
